# Patient Record
Sex: Male | Born: 2000 | Race: White | Hispanic: No | Marital: Single | Smoking: Never smoker
Health system: Southern US, Community
[De-identification: ages and names within clinical notes are randomized; demographics above are authoritative.]

## PROBLEM LIST (undated history)

## (undated) HISTORY — PX: WISDOM TOOTH EXTRACTION: SHX21

---
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
## 2022-02-04 IMAGING — CT CT RENAL STONE PROTOCOL
3 of 4 series · 8 of 46 positions shown, 15 images · non-contrast
Comparison: Scrotal evaluation from November 18, 2020.

CLINICAL DATA: Abdominal pain, hematuria in a 19-year-old male.

EXAM:
CT ABDOMEN AND PELVIS WITHOUT CONTRAST
TECHNIQUE: Multidetector CT imaging of the abdomen and pelvis was performed
following the standard protocol without IV contrast.

[Series 4: lung bases · axial · 0.69mm/px · z∈[-699,-634]mm · 4 of 23 slices shown, 9 images]
[im 5/23  soft-tissue]
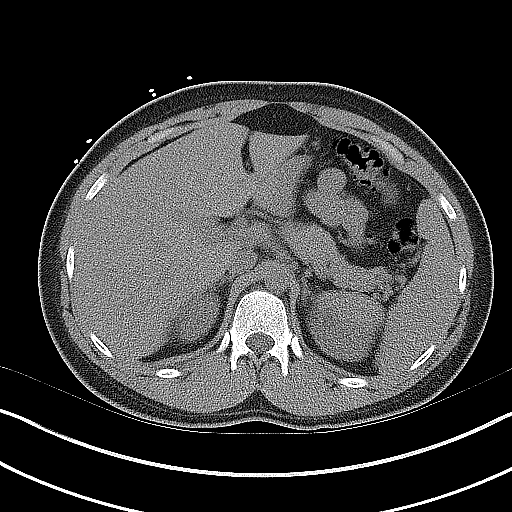
[im 5/23  lung]
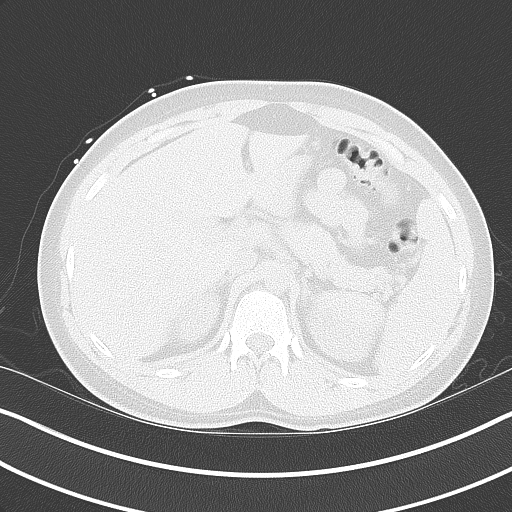
[im 5/23  bone]
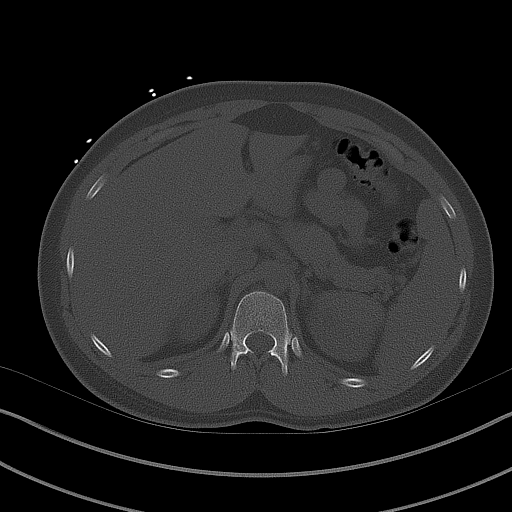
[im 9/23  soft-tissue]
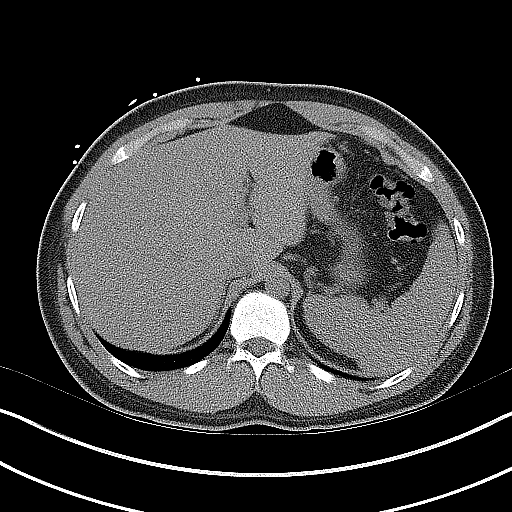
[im 9/23  lung]
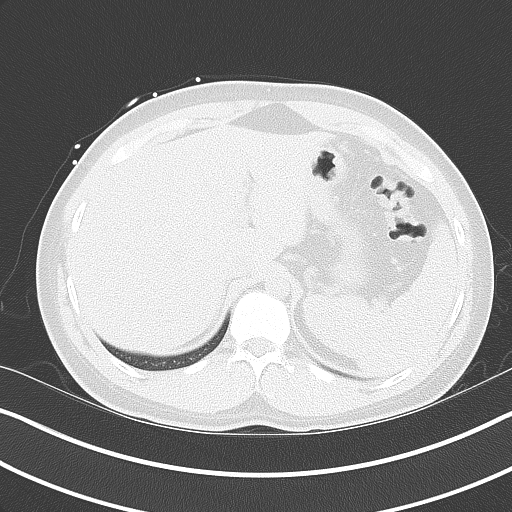
[im 14/23  soft-tissue]
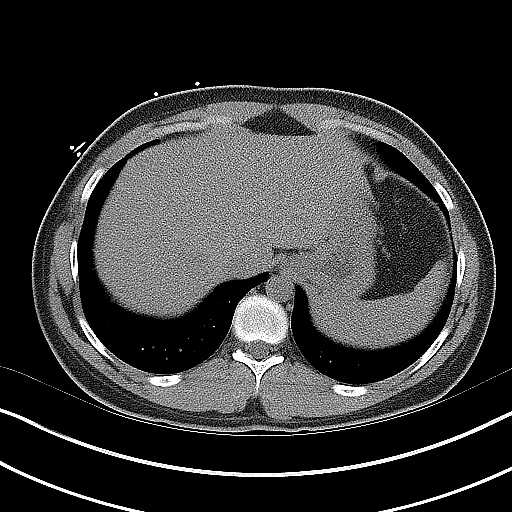
[im 14/23  lung]
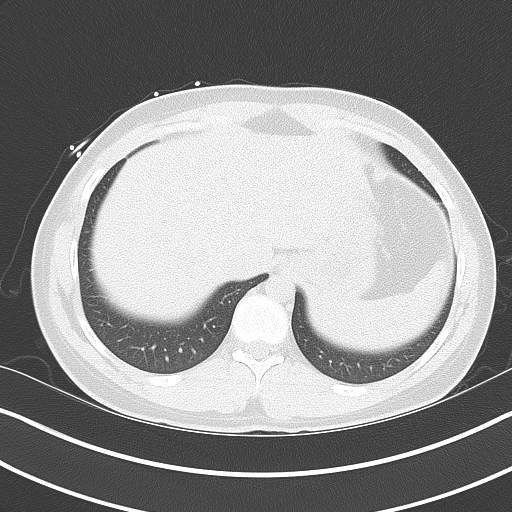
[im 18/23  soft-tissue]
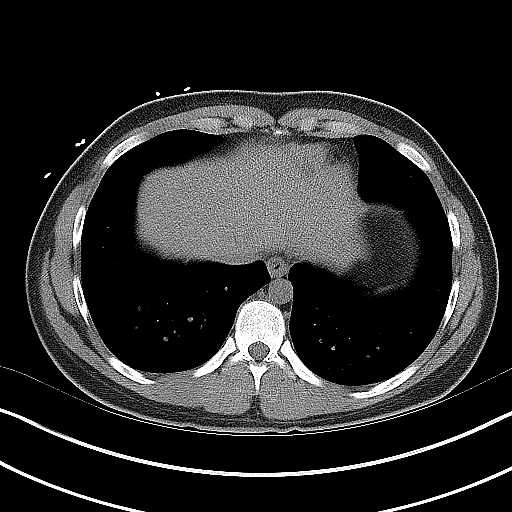
[im 18/23  lung]
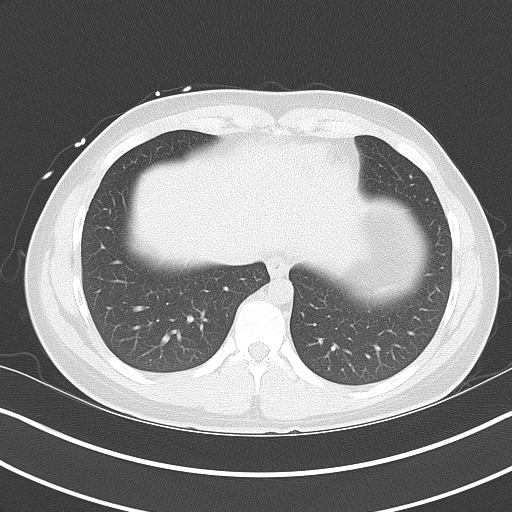

[Series 5: coronal · coronal · 0.78mm/px · 3 of 122 slices shown, 4 images]
[im 41/122  soft-tissue]
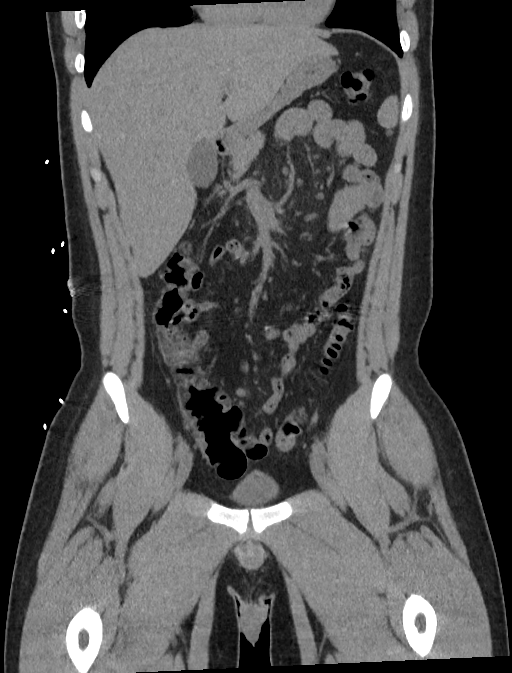
[im 54/122  soft-tissue]
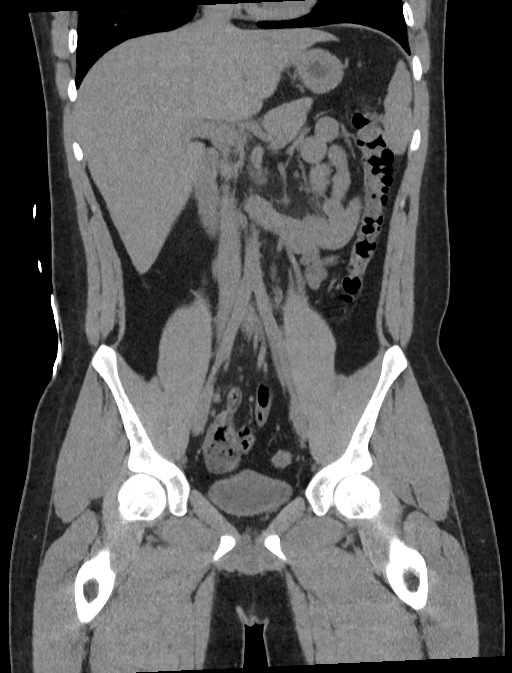
[im 54/122  bone]
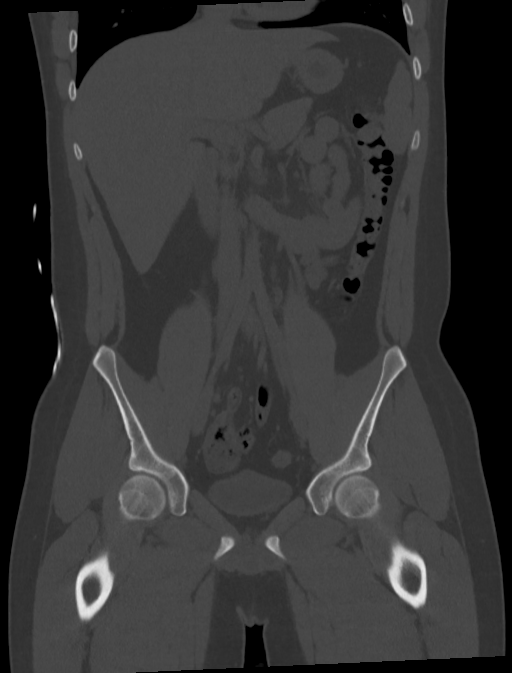
[im 68/122  soft-tissue]
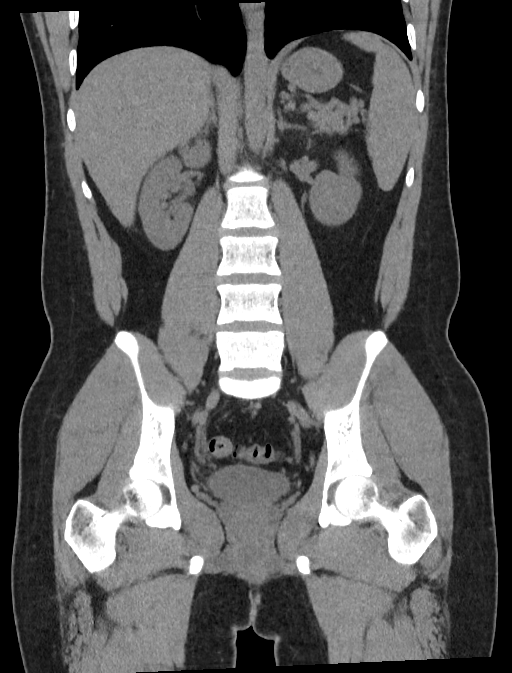

[Series 6: sagittal · sagittal · 0.54mm/px · 1 of 161 slices shown, 2 images]
[im 54/161  soft-tissue]
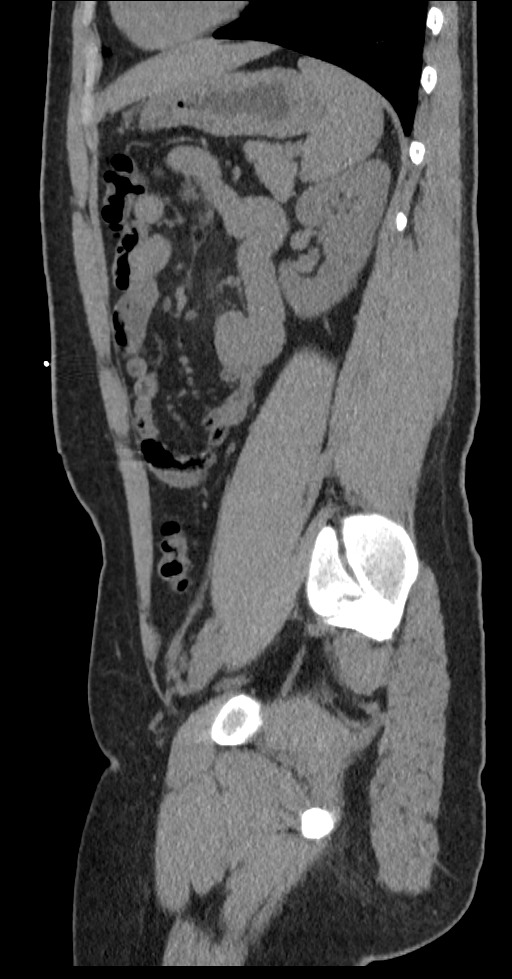
[im 54/161  bone]
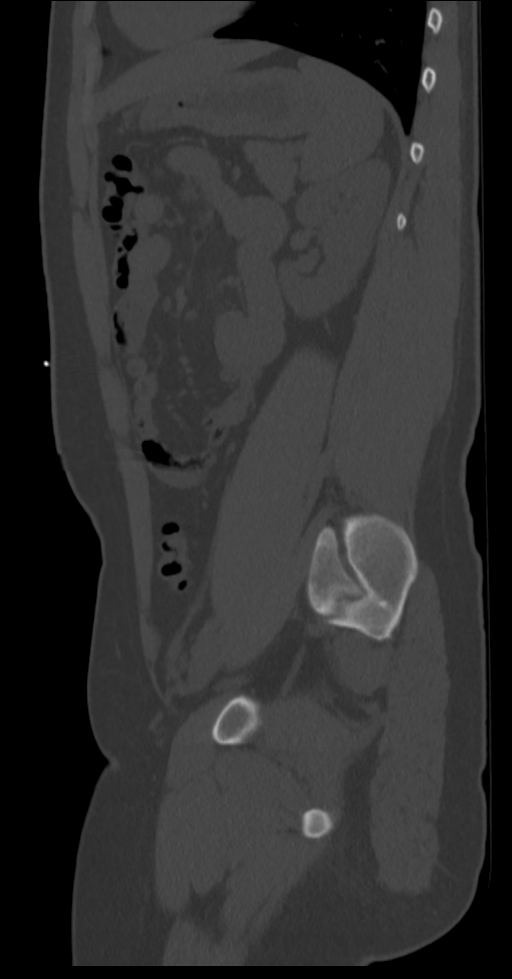

[8 of 46 positions shown; findings below may reference images not displayed]

FINDINGS: Lower chest: Lung bases are clear.

Hepatobiliary: Liver with smooth contours. No visible lesion on
noncontrast imaging. No pericholecystic stranding.

Pancreas: Pancreas with smooth contours.  No signs of inflammation.

Spleen: Normal in size and contour.  No visible lesion.

Adrenals/Urinary Tract: Adrenal glands are normal.

Nephrolithiasis bilaterally. 3-4 mm calculus in the interpolar RIGHT
kidney.

2 mm calculus in the upper pole the LEFT kidney. No perinephric
stranding. No hydronephrosis.

Mild RIGHT ureteral distension terminates at the level of the RIGHT
UVJ where there is a small calculus measuring approximately 3 mm
(image 72/5) mild RIGHT ureteral distension distally with mild Peri
ureteral stranding. Urinary bladder with smooth contours.

Stomach/Bowel: Normal appearance of stomach and small bowel. Normal
appearance of the colon. Normal appendix.

Vascular/Lymphatic: Smooth contour of the IVC and abdominal aorta
with normal caliber. There is no gastrohepatic or hepatoduodenal
ligament lymphadenopathy. No retroperitoneal or mesenteric
lymphadenopathy.

No pelvic sidewall lymphadenopathy.

Reproductive: Unremarkable

Other: No ascites. No free air. Small fat containing umbilical
hernia

Musculoskeletal: No acute musculoskeletal process. No destructive
bone finding.
IMPRESSION: 1. Mild RIGHT ureteral distension without hydronephrosis secondary
to a 3 mm RIGHT UVJ calculus.
2. Nephrolithiasis bilaterally.
3. Normal appendix.

## 2022-06-24 ENCOUNTER — Ambulatory Visit
Admission: EM | Admit: 2022-06-24 | Discharge: 2022-06-24 | Disposition: A | Discharge status: Home / Self Care | Payer: PRIVATE HEALTH INSURANCE | Attending: Emergency Medicine | Admitting: Emergency Medicine

## 2022-06-24 DIAGNOSIS — J069 Acute upper respiratory infection, unspecified: Secondary | ICD-10-CM | POA: Diagnosis not present

## 2022-06-24 LAB — POCT RAPID STREP A (OFFICE): Rapid Strep A Screen: NEGATIVE

## 2022-06-24 NOTE — Discharge Instructions (Addendum)
The strep test is negative.      Take Tylenol as needed for fever or discomfort.  Rest and keep yourself hydrated.    Follow-up with your primary care provider if your symptoms are not improving.

## 2022-06-24 NOTE — ED Triage Notes (Signed)
Patient to Urgent Care with complaints of post nasal drip/ sore throat/ cough. Denies any known fevers.   Symptoms started yesterday. Taking nyquil/ dayquil and ibuprofen.

## 2022-06-24 NOTE — ED Provider Notes (Signed)
Jason Macias    CSN: [id number] Arrival date & time: 06/24/22  1234      History   Chief Complaint Chief Complaint  Patient presents with   Nasal Congestion    HPI Jason Macias is a 22 y.o. male.  Patient presents with 1 day history of sore throat, postnasal drip, cough.  Treating with ibuprofen, Dayquil,  Nyquil.  No fever, rash, shortness of breath, vomiting, diarrhea, or other symptoms.  His medical history includes kidney stones.   The history is provided by the patient and medical records.    History reviewed. No pertinent past medical history.  There are no problems to display for this patient.   Past Surgical History:  Procedure Laterality Date   WISDOM TOOTH EXTRACTION         Home Medications    Prior to Admission medications   Medication Sig Start Date End Date Taking? Authorizing Provider  ondansetron (ZOFRAN) 4 MG tablet Take 1 tablet (4 mg total) by mouth every 8 (eight) hours as needed for up to 10 doses for nausea or vomiting. Patient not taking: Reported on 06/24/2022 11/18/20   Lucrezia Starch, MD    Family History No family history on file.  Social History Social History   Tobacco Use   Smoking status: Never   Smokeless tobacco: Never  Substance Use Topics   Alcohol use: Not Currently   Drug use: Not Currently     Allergies   Patient has no known allergies.   Review of Systems Review of Systems  Constitutional:  Negative for chills and fever.  HENT:  Positive for postnasal drip and sore throat. Negative for ear pain.   Respiratory:  Positive for cough. Negative for shortness of breath.   Cardiovascular:  Negative for chest pain and palpitations.  Gastrointestinal:  Negative for diarrhea and vomiting.  Skin:  Negative for color change and rash.  All other systems reviewed and are negative.    Physical Exam Triage Vital Signs ED Triage Vitals  Enc Vitals Group     BP      Pulse      Resp      Temp      Temp src       SpO2      Weight      Height      Head Circumference      Peak Flow      Pain Score      Pain Loc      Pain Edu?      Excl. in [place]?    No data found.  Updated Vital Signs BP 122/77   Pulse (!) 107   Temp 98.3 F (36.8 C)   Resp 18   SpO2 96%   Visual Acuity Right Eye Distance:   Left Eye Distance:   Bilateral Distance:    Right Eye Near:   Left Eye Near:    Bilateral Near:     Physical Exam Vitals and nursing note reviewed.  Constitutional:      General: He is not in acute distress.    Appearance: Normal appearance. He is well-developed. He is not ill-appearing.  HENT:     Right Ear: Tympanic membrane normal.     Left Ear: Tympanic membrane normal.     Nose: Nose normal.     Mouth/Throat:     Mouth: Mucous membranes are moist.     Pharynx: Oropharynx is clear.     Comments: Clear PND.  Cardiovascular:     Rate and Rhythm: Normal rate and regular rhythm.     Heart sounds: Normal heart sounds.  Pulmonary:     Effort: Pulmonary effort is normal. No respiratory distress.     Breath sounds: Normal breath sounds.  Musculoskeletal:     Cervical back: Neck supple.  Skin:    General: Skin is warm and dry.  Neurological:     Mental Status: He is alert.  Psychiatric:        Mood and Affect: Mood normal.        Behavior: Behavior normal.      UC Treatments / Results  Labs (all labs ordered are listed, but only abnormal results are displayed) Labs Reviewed  POCT RAPID STREP A (OFFICE)    EKG   Radiology No results found.  Procedures Procedures (including critical care time)  Medications Ordered in UC Medications - No data to display  Initial Impression / Assessment and Plan / UC Course  I have reviewed the triage vital signs and the nursing notes.  Pertinent labs & imaging results that were available during my care of the patient were reviewed by me and considered in my medical decision making (see chart for details).    Viral URI.  Rapid  strep negative.  Patient declines COVID test. Discussed symptomatic treatment including Tylenol, rest, hydration.  Instructed patient to follow up with PCP if symptoms are not improving.  He agrees to plan of care.   Final Clinical Impressions(s) / UC Diagnoses   Final diagnoses:  Viral URI     Discharge Instructions      The strep test is negative.      Take Tylenol as needed for fever or discomfort.  Rest and keep yourself hydrated.    Follow-up with your primary care provider if your symptoms are not improving.         ED Prescriptions   None    PDMP not reviewed this encounter.   Sharion Balloon, NP 06/24/22 1315
